# Patient Record
Sex: Female | Born: 1970 | Race: White | Hispanic: No | Marital: Married | Smoking: Never smoker
Health system: Southern US, Community
[De-identification: ages and names within clinical notes are randomized; demographics above are authoritative.]

## PROBLEM LIST (undated history)

## (undated) DIAGNOSIS — I8390 Asymptomatic varicose veins of unspecified lower extremity: Secondary | ICD-10-CM

## (undated) DIAGNOSIS — M79603 Pain in arm, unspecified: Secondary | ICD-10-CM

## (undated) DIAGNOSIS — D219 Benign neoplasm of connective and other soft tissue, unspecified: Secondary | ICD-10-CM

## (undated) HISTORY — DX: Pain in arm, unspecified: M79.603

## (undated) HISTORY — PX: NO PAST SURGERIES: SHX2092

## (undated) HISTORY — DX: Asymptomatic varicose veins of unspecified lower extremity: I83.90

## (undated) HISTORY — DX: Benign neoplasm of connective and other soft tissue, unspecified: D21.9

---

## 2005-06-27 ENCOUNTER — Other Ambulatory Visit: Admission: RE | Admit: 2005-06-27 | Discharge: 2005-06-27 | Payer: Self-pay | Admitting: Obstetrics and Gynecology

## 2005-07-07 ENCOUNTER — Other Ambulatory Visit: Admission: RE | Admit: 2005-07-07 | Discharge: 2005-07-07 | Payer: Self-pay | Admitting: Obstetrics and Gynecology

## 2007-04-21 ENCOUNTER — Emergency Department (HOSPITAL_COMMUNITY): Admission: EM | Admit: 2007-04-21 | Discharge: 2007-04-21 | Payer: Self-pay | Admitting: Emergency Medicine

## 2007-08-12 ENCOUNTER — Inpatient Hospital Stay (HOSPITAL_COMMUNITY): Admission: AD | Admit: 2007-08-12 | Discharge: 2007-08-15 | Payer: Self-pay | Admitting: Obstetrics and Gynecology

## 2008-08-03 ENCOUNTER — Ambulatory Visit (HOSPITAL_COMMUNITY): Admission: RE | Admit: 2008-08-03 | Discharge: 2008-08-03 | Payer: Self-pay | Admitting: Obstetrics and Gynecology

## 2008-09-11 ENCOUNTER — Inpatient Hospital Stay (HOSPITAL_COMMUNITY): Admission: AD | Admit: 2008-09-11 | Discharge: 2008-09-11 | Payer: Self-pay | Admitting: Obstetrics and Gynecology

## 2009-03-14 ENCOUNTER — Inpatient Hospital Stay (HOSPITAL_COMMUNITY): Admission: AD | Admit: 2009-03-14 | Discharge: 2009-03-16 | Payer: Self-pay | Admitting: Obstetrics and Gynecology

## 2009-09-28 ENCOUNTER — Emergency Department (HOSPITAL_COMMUNITY): Admission: EM | Admit: 2009-09-28 | Discharge: 2009-09-28 | Payer: Self-pay | Admitting: Emergency Medicine

## 2009-10-01 ENCOUNTER — Encounter: Admission: RE | Admit: 2009-10-01 | Discharge: 2009-10-01 | Payer: Self-pay | Admitting: Obstetrics and Gynecology

## 2010-03-21 ENCOUNTER — Emergency Department (HOSPITAL_BASED_OUTPATIENT_CLINIC_OR_DEPARTMENT_OTHER): Admission: EM | Admit: 2010-03-21 | Discharge: 2010-03-21 | Payer: Self-pay | Admitting: Emergency Medicine

## 2010-10-26 ENCOUNTER — Inpatient Hospital Stay (HOSPITAL_COMMUNITY): Payer: BC Managed Care – PPO

## 2010-10-26 ENCOUNTER — Inpatient Hospital Stay (HOSPITAL_COMMUNITY)
Admission: AD | Admit: 2010-10-26 | Discharge: 2010-10-28 | DRG: 886 | Disposition: A | Discharge status: Home / Self Care | Payer: BC Managed Care – PPO | Source: Ambulatory Visit | Attending: Obstetrics and Gynecology | Admitting: Obstetrics and Gynecology

## 2010-10-26 ENCOUNTER — Encounter (HOSPITAL_COMMUNITY): Payer: Self-pay | Admitting: Radiology

## 2010-10-26 DIAGNOSIS — O2390 Unspecified genitourinary tract infection in pregnancy, unspecified trimester: Secondary | ICD-10-CM

## 2010-10-26 DIAGNOSIS — R109 Unspecified abdominal pain: Secondary | ICD-10-CM

## 2010-10-26 DIAGNOSIS — N12 Tubulo-interstitial nephritis, not specified as acute or chronic: Secondary | ICD-10-CM

## 2010-10-26 LAB — URINALYSIS, ROUTINE W REFLEX MICROSCOPIC
Bilirubin Urine: NEGATIVE
Urine Glucose, Fasting: NEGATIVE mg/dL
Urobilinogen, UA: 0.2 mg/dL (ref 0.0–1.0)
pH: 6.5 (ref 5.0–8.0)

## 2010-10-26 LAB — COMPREHENSIVE METABOLIC PANEL
AST: 14 U/L (ref 0–37)
BUN: 8 mg/dL (ref 6–23)
CO2: 22 mEq/L (ref 19–32)
Calcium: 8.8 mg/dL (ref 8.4–10.5)
Creatinine, Ser: 0.45 mg/dL (ref 0.4–1.2)
GFR calc non Af Amer: >60 mL/min (ref >60)
Glucose, Bld: 80 mg/dL (ref 70–99)
Potassium: 4.2 mEq/L (ref 3.5–5.1)
Total Bilirubin: 0.7 mg/dL (ref 0.3–1.2)
Total Protein: 6.6 g/dL (ref 6.0–8.3)

## 2010-10-26 LAB — DIFFERENTIAL
Basophils Absolute: 0 10*3/uL (ref 0.0–0.1)
Basophils Relative: 0 % (ref 0–1)
Eosinophils Relative: 0 % (ref 0–5)
Lymphocytes Relative: 10 % — ABNORMAL LOW (ref 12–46)

## 2010-10-26 LAB — CBC
Hemoglobin: 11.9 g/dL — ABNORMAL LOW (ref 12.0–15.0)
MCV: 87.6 fL (ref 78.0–100.0)
Platelets: 194 10*3/uL (ref 150–400)
RBC: 3.88 MIL/uL (ref 3.87–5.11)
RDW: 13.6 % (ref 11.5–15.5)
WBC: 14.9 10*3/uL — ABNORMAL HIGH (ref 4.0–10.5)

## 2010-10-26 LAB — URINE MICROSCOPIC-ADD ON

## 2010-10-27 LAB — CBC
Hemoglobin: 10.4 g/dL — ABNORMAL LOW (ref 12.0–15.0)
MCHC: 34.3 g/dL (ref 30.0–36.0)
WBC: 9 10*3/uL (ref 4.0–10.5)

## 2010-10-28 LAB — URINE CULTURE

## 2010-11-14 NOTE — Discharge Summary (Signed)
  NAME:  Maria Leonard, BARRIE            ACCOUNT NO.:  [id number]  MEDICAL RECORD NO.:  [id number]           PATIENT TYPE:  I  LOCATION:  9157                          FACILITY:  [facility]  PHYSICIAN:  Carrington Clamp, M.D. DATE OF BIRTH:  1971-02-07  DATE OF ADMISSION:  10/26/2010 DATE OF DISCHARGE:  10/28/2010                              DISCHARGE SUMMARY   FINAL DIAGNOSES: 1. Intrauterine pregnancy at 29-1/7 weeks' gestation. 2. Flank pain. 3. Pyelonephritis.  COMPLICATIONS:  None.  This 40 year old G2, P1-0-0-1 presents at 29+ weeks' gestation with nausea and flank pain.  The patient was afebrile, but her urinalysis showed numerous white blood cells and red blood cells.  The patient did have CVA tenderness and had an elevated white count of 14.9.  She was admitted.  Fetal heart tones were good and the patient was having good fetal movement.  The patient was started on IV hydration, Pain medicin,e and gentamicin and clindamycin to treat pyelonephritis. The patient has a PENICILLIN allergy.  By hospital day #1, the patient's pain had resolved.  Her white blood cell count had decreased to 9.0 and she was feeling much better.  She was kept in-house until hospital day #3.  Complete pain resolved.  No nausea, vomiting, eating well and was ready for discharge.  She remained afebrile since admission.  Good fetal movement with no abdominal cramping or contraction.  She was sent home with oral erythromycin 333 to take one t.i.d. for a full 7 days.  The patient desires to continue straining her urine in case there was a possible kidney stone.  She was sent home with a strainer.  She has an appointment on Tuesday for recheck.  She is to keep this appointment, and instructions and precautions were reviewed with the patient.  LABS ON DISCHARGE:  The patient's he white blood cell count had decreased to 9.0 from 14.9.     Leilani Able, P.A.-C.   ______________________________ Carrington Clamp, M.D.    MB/MEDQ  D:  10/28/2010  T:  10/28/2010  Job:  [id number]  Electronically Signed by Leilani Able P.A.-C. on 10/28/2010 03:33:52 PM Electronically Signed by Carrington Clamp MD on 11/14/2010 08:05:39 AM

## 2010-12-18 LAB — CBC
HCT: 32.1 % — ABNORMAL LOW (ref 36.0–46.0)
HCT: 34.2 % — ABNORMAL LOW (ref 36.0–46.0)
Hemoglobin: 11.5 g/dL — ABNORMAL LOW (ref 12.0–15.0)
Hemoglobin: 12.2 g/dL (ref 12.0–15.0)
MCHC: 35.6 g/dL (ref 30.0–36.0)
Platelets: 125 10*3/uL — ABNORMAL LOW (ref 150–400)
RBC: 3.5 MIL/uL — ABNORMAL LOW (ref 3.87–5.11)
RDW: 13.4 % (ref 11.5–15.5)
WBC: 9.6 10*3/uL (ref 4.0–10.5)
WBC: 9.8 10*3/uL (ref 4.0–10.5)

## 2010-12-18 LAB — RPR: RPR Ser Ql: NONREACTIVE

## 2010-12-26 LAB — COMPREHENSIVE METABOLIC PANEL
Alkaline Phosphatase: 34 U/L — ABNORMAL LOW (ref 39–117)
BUN: 10 mg/dL (ref 6–23)
CO2: 20 mEq/L (ref 19–32)
Calcium: 8 mg/dL — ABNORMAL LOW (ref 8.4–10.5)
Creatinine, Ser: 0.44 mg/dL (ref 0.4–1.2)
Glucose, Bld: 119 mg/dL — ABNORMAL HIGH (ref 70–99)
Sodium: 135 mEq/L (ref 135–145)
Total Bilirubin: 1 mg/dL (ref 0.3–1.2)
Total Protein: 6.2 g/dL (ref 6.0–8.3)

## 2010-12-26 LAB — URINALYSIS, ROUTINE W REFLEX MICROSCOPIC
Leukocytes, UA: NEGATIVE
Protein, ur: NEGATIVE mg/dL
Urobilinogen, UA: 0.2 mg/dL (ref 0.0–1.0)

## 2010-12-26 LAB — CBC
Hemoglobin: 12.6 g/dL (ref 12.0–15.0)
MCHC: 35.3 g/dL (ref 30.0–36.0)
MCV: 86.4 fL (ref 78.0–100.0)
RBC: 4.14 MIL/uL (ref 3.87–5.11)
RDW: 12.8 % (ref 11.5–15.5)

## 2010-12-26 LAB — URINE MICROSCOPIC-ADD ON

## 2011-01-06 ENCOUNTER — Inpatient Hospital Stay (HOSPITAL_COMMUNITY)
Admission: AD | Admit: 2011-01-06 | Discharge: 2011-01-06 | Disposition: A | Discharge status: Home / Self Care | Payer: BC Managed Care – PPO | Source: Ambulatory Visit | Attending: Obstetrics and Gynecology | Admitting: Obstetrics and Gynecology

## 2011-01-06 DIAGNOSIS — O479 False labor, unspecified: Secondary | ICD-10-CM | POA: Insufficient documentation

## 2011-01-07 ENCOUNTER — Inpatient Hospital Stay (HOSPITAL_COMMUNITY): Payer: BC Managed Care – PPO

## 2011-01-09 ENCOUNTER — Inpatient Hospital Stay (HOSPITAL_COMMUNITY)
Admission: AD | Admit: 2011-01-09 | Discharge: 2011-01-11 | DRG: 373 | Disposition: A | Discharge status: Home / Self Care | Payer: BC Managed Care – PPO | Source: Ambulatory Visit | Attending: Obstetrics & Gynecology | Admitting: Obstetrics & Gynecology

## 2011-01-09 DIAGNOSIS — O09529 Supervision of elderly multigravida, unspecified trimester: Secondary | ICD-10-CM | POA: Diagnosis present

## 2011-01-09 LAB — CBC
MCV: 88.4 fL (ref 78.0–100.0)
WBC: 11.6 10*3/uL — ABNORMAL HIGH (ref 4.0–10.5)

## 2011-01-10 LAB — CBC
HCT: 30.4 % — ABNORMAL LOW (ref 36.0–46.0)
MCH: 30.3 pg (ref 26.0–34.0)
MCHC: 34.2 g/dL (ref 30.0–36.0)
MCV: 88.6 fL (ref 78.0–100.0)
Platelets: 150 10*3/uL (ref 150–400)
RDW: 13.5 % (ref 11.5–15.5)

## 2011-01-24 NOTE — Discharge Summary (Signed)
NAME:  Maria Leonard, Maria Leonard            ACCOUNT NO.:  [id number]   MEDICAL RECORD NO.:  [id number]          PATIENT TYPE:  INP   LOCATION:  9130                          FACILITY:  [facility]   PHYSICIAN:  Gerrit Friends. Aldona Bar, M.D.   DATE OF BIRTH:  [birth year]   DATE OF ADMISSION:  08/12/2007  DATE OF DISCHARGE:  08/15/2007                               DISCHARGE SUMMARY   DISCHARGE DIAGNOSES:  1. Term pregnancy, delivered 8 pounds 5 ounces female infant, Apgars 8      and 9.  2. Blood type AB positive.   PROCEDURES:  1. Low forceps delivery.  2. Vaginal tear and right mediolateral tear and repair.   SUMMARY:  This 40 year old primigravida was admitted at term with  ruptured membranes and some contractions.  She completed the first stage  without difficulty.  A second stage of her labor was complicated by  variable decelerations and tachycardia.  After an hour and half of  pushing Dr. Dareen Piano delivered the patient with Simpson forceps placed  at +2 station/ROA.  She was delivered of an 8 pound 5 ounce female infant.  A very short cord (15 cm) was noted.  A right vaginal wall tear and a  right mediolateral tear were repaired without difficulty.  The patient's  postpartum course subsequently was relatively uncomplicated except with  the problem that she was having with her chronic hemorrhoids.  Discharge  hemoglobin on the morning of December 3 was 8.8 with a white count  12,300 and platelet count 132,000.  On the morning of December 4 she was  stable.  Her breast-feeding was going well.  She was normotensive.  Her  bottom was more comfortable.  She was having normal bowel and bladder  function, and after discussion with the patient she was deemed ready for  discharge and accordingly was given all appropriate instructions and  understood all instructions well.   Discharge brochure was given to the patient at time of discharge.  Medications will include vitamins one a day as long she is breast-  feeding and Feosol capsules - one daily.  She was also given a  prescription for Tylox to use 1-2 every 4-6 hours as needed for severe  pain and Motrin 600 mg every six hours as needed for lesser pain and  cramping.  She was also instructed on the use of over-the-counter  medications for her hemorrhoids.   She will return to the office follow-up in approximately four weeks'  time or as needed.   CONDITION ON DISCHARGE:  Improved.      Gerrit Friends. Aldona Bar, M.D.  Electronically Signed     RMW/MEDQ  D:  08/15/2007  T:  08/15/2007  Job:  [id number]

## 2011-06-19 LAB — CBC
MCHC: 36
MCV: 89.6
Platelets: 132 — ABNORMAL LOW
Platelets: 206
RDW: 13.2
WBC: 12.3 — ABNORMAL HIGH

## 2011-06-19 LAB — RPR: RPR Ser Ql: NONREACTIVE

## 2011-06-26 LAB — CBC
HCT: 32.2 — ABNORMAL LOW
MCV: 88.1
Platelets: 204
RDW: 13.5
WBC: 11.8 — ABNORMAL HIGH

## 2011-06-26 LAB — POCT I-STAT CREATININE: Operator id: 192351

## 2011-06-26 LAB — I-STAT 8, (EC8 V) (CONVERTED LAB)
Acid-base deficit: 3 — ABNORMAL HIGH
Bicarbonate: 21.8
Chloride: 107
pCO2, Ven: 37.7 — ABNORMAL LOW
pH, Ven: 7.37 — ABNORMAL HIGH

## 2011-06-26 LAB — DIFFERENTIAL
Basophils Absolute: 0
Basophils Relative: 0
Eosinophils Absolute: 0.1
Eosinophils Relative: 1
Lymphs Abs: 1.8
Neutrophils Relative %: 74

## 2011-06-26 LAB — POCT CARDIAC MARKERS
CKMB, poc: <1 — ABNORMAL LOW
Myoglobin, poc: 22.9
Operator id: 192351
Troponin i, poc: <0.05

## 2011-06-27 ENCOUNTER — Encounter (HOSPITAL_COMMUNITY): Payer: Self-pay | Admitting: *Deleted

## 2012-04-10 ENCOUNTER — Other Ambulatory Visit: Payer: Self-pay | Admitting: Obstetrics and Gynecology

## 2012-04-10 DIAGNOSIS — N63 Unspecified lump in unspecified breast: Secondary | ICD-10-CM

## 2012-04-15 ENCOUNTER — Other Ambulatory Visit: Payer: BC Managed Care – PPO

## 2012-04-17 ENCOUNTER — Other Ambulatory Visit: Payer: Self-pay | Admitting: Obstetrics and Gynecology

## 2012-04-17 ENCOUNTER — Ambulatory Visit
Admission: RE | Admit: 2012-04-17 | Discharge: 2012-04-17 | Disposition: A | Discharge status: Home / Self Care | Payer: BC Managed Care – PPO | Source: Ambulatory Visit | Attending: Obstetrics and Gynecology | Admitting: Obstetrics and Gynecology

## 2012-04-17 DIAGNOSIS — N63 Unspecified lump in unspecified breast: Secondary | ICD-10-CM

## 2013-03-11 DEATH — deceased

## 2013-04-10 ENCOUNTER — Other Ambulatory Visit: Payer: Self-pay

## 2013-04-10 ENCOUNTER — Other Ambulatory Visit: Payer: Self-pay | Admitting: Obstetrics and Gynecology

## 2013-04-10 DIAGNOSIS — Z1231 Encounter for screening mammogram for malignant neoplasm of breast: Secondary | ICD-10-CM

## 2013-04-29 ENCOUNTER — Ambulatory Visit
Admission: RE | Admit: 2013-04-29 | Discharge: 2013-04-29 | Disposition: A | Discharge status: Home / Self Care | Payer: BC Managed Care – PPO | Source: Ambulatory Visit

## 2013-04-29 ENCOUNTER — Other Ambulatory Visit: Payer: Self-pay | Admitting: Family Medicine

## 2013-04-29 DIAGNOSIS — M546 Pain in thoracic spine: Secondary | ICD-10-CM

## 2013-04-29 DIAGNOSIS — Z1231 Encounter for screening mammogram for malignant neoplasm of breast: Secondary | ICD-10-CM

## 2013-05-02 ENCOUNTER — Ambulatory Visit
Admission: RE | Admit: 2013-05-02 | Discharge: 2013-05-02 | Disposition: A | Discharge status: Home / Self Care | Payer: BC Managed Care – PPO | Source: Ambulatory Visit | Attending: Family Medicine | Admitting: Family Medicine

## 2013-05-02 DIAGNOSIS — M546 Pain in thoracic spine: Secondary | ICD-10-CM

## 2014-03-25 ENCOUNTER — Other Ambulatory Visit: Payer: Self-pay

## 2014-03-25 DIAGNOSIS — Z1231 Encounter for screening mammogram for malignant neoplasm of breast: Secondary | ICD-10-CM

## 2014-05-04 ENCOUNTER — Ambulatory Visit
Admission: RE | Admit: 2014-05-04 | Discharge: 2014-05-04 | Disposition: A | Discharge status: Home / Self Care | Payer: BC Managed Care – PPO | Source: Ambulatory Visit

## 2014-05-04 DIAGNOSIS — Z1231 Encounter for screening mammogram for malignant neoplasm of breast: Secondary | ICD-10-CM

## 2014-07-13 ENCOUNTER — Encounter (HOSPITAL_COMMUNITY): Payer: Self-pay | Admitting: *Deleted

## 2015-03-09 ENCOUNTER — Other Ambulatory Visit: Payer: Self-pay

## 2015-03-09 DIAGNOSIS — Z1231 Encounter for screening mammogram for malignant neoplasm of breast: Secondary | ICD-10-CM

## 2015-05-11 ENCOUNTER — Ambulatory Visit
Admission: RE | Admit: 2015-05-11 | Discharge: 2015-05-11 | Disposition: A | Discharge status: Home / Self Care | Payer: BLUE CROSS/BLUE SHIELD | Source: Ambulatory Visit

## 2015-05-11 DIAGNOSIS — Z1231 Encounter for screening mammogram for malignant neoplasm of breast: Secondary | ICD-10-CM

## 2015-07-14 ENCOUNTER — Encounter: Payer: Self-pay | Admitting: Neurology

## 2015-07-14 ENCOUNTER — Ambulatory Visit (INDEPENDENT_AMBULATORY_CARE_PROVIDER_SITE_OTHER): Payer: BLUE CROSS/BLUE SHIELD | Admitting: Neurology

## 2015-07-14 VITALS — BP 102/67 | HR 80 | Ht 63.0 in | Wt 152.0 lb

## 2015-07-14 DIAGNOSIS — M79601 Pain in right arm: Secondary | ICD-10-CM | POA: Insufficient documentation

## 2015-07-14 NOTE — Progress Notes (Signed)
PATIENT: Maria Leonard DOB: May 24, 1971  Chief Complaint  Patient presents with  . Arm Pain    She has been having pain in her right arm and swelling her right hand.  Symptoms are intermittent and started several years ago.     HISTORICAL  Maria SmithIs a 44 years old right-handed female, seen in refer by her primary care physician Dr. Betty G Martinique in November third 2016 for evaluation of right arm deep achy pain,  She reported since 2012, without clear triggers, she noticed deep achy pain starting from right lateral shoulder, radiating to right arm, symptoms has been persistent since onset, no significant worsening, she also noticed right hand swelling in the morning, she denies neck pain, no weakness, no sensory loss.  She denied left side involvement, no gait difficulty.  I have personally reviewed MRI of the thoracic spine August 2014 for evaluation of upper back pain, no significant abnormality, mild T7-8 degenerative disc disease, no canal stenosis.  X-ray of right humerus showed sclerotic focus within the right humeral neck region which could represent a bone infarct old or enchondroma  REVIEW OF SYSTEMS: Full 14 system review of systems performed and notable only for memory loss, insomnia, not enough sleep  ALLERGIES: Allergies  Allergen Reactions  . Penicillins Hives    HOME MEDICATIONS: Current Outpatient Prescriptions  Medication Sig Dispense Refill  . diphenhydramine-acetaminophen (TYLENOL PM) 25-500 MG TABS tablet Take 1 tablet by mouth at bedtime as needed.    . Famotidine (PEPCID PO) Take by mouth as needed.     No current facility-administered medications for this visit.    PAST MEDICAL HISTORY: Past Medical History  Diagnosis Date  . Fibroids   . Varicose veins   . Upper extremity pain     PAST SURGICAL HISTORY: Past Surgical History  Procedure Laterality Date  . No past surgeries      FAMILY HISTORY: Family History  Problem  Relation Age of Onset  . Lung cancer Father   . Pancreatic cancer Paternal Grandfather   . Liver cancer Paternal Grandfather   . Heart attack Paternal Grandmother     SOCIAL HISTORY:  Social History   Social History  . Marital Status: Married    Spouse Name: N/A  . Number of Children: 3  . Years of Education: Masters   Occupational History  . Business Controller    Social History Main Topics  . Smoking status: Never Smoker   . Smokeless tobacco: Not on file  . Alcohol Use: 0.0 oz/week    0 Standard drinks or equivalent per week     Comment: Social  . Drug Use: No  . Sexual Activity: Not on file   Other Topics Concern  . Not on file   Social History Narrative   Lives at home with husband, three children and her mother.   Right-handed.   Occasional caffeine use.     PHYSICAL EXAM   Filed Vitals:   07/14/15 1554  BP: 102/67  Pulse: 80  Height: 5\' 3"  (1.6 m)  Weight: 152 lb (68.947 kg)    Not recorded      Body mass index is 26.93 kg/(m^2).  PHYSICAL EXAMNIATION:  Gen: NAD, conversant, well nourised, obese, well groomed                     Cardiovascular: Regular rate rhythm, no peripheral edema, warm, nontender. Eyes: Conjunctivae clear without exudates or hemorrhage Neck: Supple, no carotid bruise. Pulmonary:  Clear to auscultation bilaterally   NEUROLOGICAL EXAM:  MENTAL STATUS: Speech:    Speech is normal; fluent and spontaneous with normal comprehension.  Cognition:     Orientation to time, place and person     Normal recent and remote memory     Normal Attention span and concentration     Normal Language, naming, repeating,spontaneous speech     Fund of knowledge   CRANIAL NERVES: CN II: Visual fields are full to confrontation. Fundoscopic exam is normal with sharp discs and no vascular changes. Pupils are round equal and briskly reactive to light. CN III, IV, VI: extraocular movement are normal. No ptosis. CN V: Facial sensation is intact  to pinprick in all 3 divisions bilaterally. Corneal responses are intact.  CN VII: Face is symmetric with normal eye closure and smile. CN VIII: Hearing is normal to rubbing fingers CN IX, X: Palate elevates symmetrically. Phonation is normal. CN XI: Head turning and shoulder shrug are intact CN XII: Tongue is midline with normal movements and no atrophy.  MOTOR: There is no pronator drift of out-stretched arms. Muscle bulk and tone are normal. Muscle strength is normal.  REFLEXES: Reflexes are 2+ and symmetric at the biceps, triceps, knees, and ankles. Plantar responses are flexor.  SENSORY: Intact to light touch, pinprick, position sense, and vibration sense are intact in fingers and toes.  COORDINATION: Rapid alternating movements and fine finger movements are intact. There is no dysmetria on finger-to-nose and heel-knee-shin.    GAIT/STANCE: Posture is normal. Gait is steady with normal steps, base, arm swing, and turning. Heel and toe walking are normal. Tandem gait is normal.  Romberg is absent.   DIAGNOSTIC DATA (LABS, IMAGING, TESTING) - I reviewed patient records, labs, notes, testing and imaging myself where available.   ASSESSMENT AND PLAN  Eliabeth Leonard is a 44 y.o. female   Chronic right arm deep achy pain,  Normal neurological examinations, no evidence of right upper extremity neuropathy or right cervical radiculopathy  X-ray of right humerus in October 2016 showed sclerotic focus within the right humeral neck which could represent bone infarct or enchondroma.  Have encouraged her neck shoulder stretching exercise, when necessary NSAIDs, hot compression   If she continue complaining worsening symptoms, may consider MRI of right humeral region with described mild abnormality.  Marcial Pacas, M.D. Ph.D.  [facility] [address], [place], [place] [address] Ph: [phone] Fax: [phone]  CC: Betty G Martinique, MD

## 2015-07-15 NOTE — Addendum Note (Signed)
Addended by: Marcial Pacas on: 07/15/2015 10:36 AM   Modules accepted: Level of Service

## 2016-03-30 ENCOUNTER — Other Ambulatory Visit: Payer: Self-pay | Admitting: Obstetrics and Gynecology

## 2016-03-30 DIAGNOSIS — Z1231 Encounter for screening mammogram for malignant neoplasm of breast: Secondary | ICD-10-CM

## 2016-04-04 DIAGNOSIS — R7611 Nonspecific reaction to tuberculin skin test without active tuberculosis: Secondary | ICD-10-CM | POA: Diagnosis not present

## 2016-04-04 DIAGNOSIS — Z0000 Encounter for general adult medical examination without abnormal findings: Secondary | ICD-10-CM | POA: Diagnosis not present

## 2016-04-04 DIAGNOSIS — Z113 Encounter for screening for infections with a predominantly sexual mode of transmission: Secondary | ICD-10-CM | POA: Diagnosis not present

## 2016-05-16 ENCOUNTER — Ambulatory Visit
Admission: RE | Admit: 2016-05-16 | Discharge: 2016-05-16 | Disposition: A | Discharge status: Home / Self Care | Payer: BLUE CROSS/BLUE SHIELD | Source: Ambulatory Visit | Attending: Obstetrics and Gynecology | Admitting: Obstetrics and Gynecology

## 2016-05-16 DIAGNOSIS — Z1231 Encounter for screening mammogram for malignant neoplasm of breast: Secondary | ICD-10-CM

## 2016-07-13 DIAGNOSIS — Z01419 Encounter for gynecological examination (general) (routine) without abnormal findings: Secondary | ICD-10-CM | POA: Diagnosis not present

## 2016-07-13 DIAGNOSIS — Z6827 Body mass index (BMI) 27.0-27.9, adult: Secondary | ICD-10-CM | POA: Diagnosis not present

## 2016-07-13 DIAGNOSIS — Z124 Encounter for screening for malignant neoplasm of cervix: Secondary | ICD-10-CM | POA: Diagnosis not present

## 2016-12-14 DIAGNOSIS — M25511 Pain in right shoulder: Secondary | ICD-10-CM | POA: Diagnosis not present

## 2016-12-14 DIAGNOSIS — M67431 Ganglion, right wrist: Secondary | ICD-10-CM | POA: Diagnosis not present

## 2017-01-02 DIAGNOSIS — L609 Nail disorder, unspecified: Secondary | ICD-10-CM | POA: Diagnosis not present

## 2017-01-02 DIAGNOSIS — Z1159 Encounter for screening for other viral diseases: Secondary | ICD-10-CM | POA: Diagnosis not present

## 2017-01-02 DIAGNOSIS — Z0282 Encounter for adoption services: Secondary | ICD-10-CM | POA: Diagnosis not present

## 2017-01-15 DIAGNOSIS — M940 Chondrocostal junction syndrome [Tietze]: Secondary | ICD-10-CM | POA: Diagnosis not present

## 2017-01-15 DIAGNOSIS — R079 Chest pain, unspecified: Secondary | ICD-10-CM | POA: Diagnosis not present

## 2017-01-15 DIAGNOSIS — R1310 Dysphagia, unspecified: Secondary | ICD-10-CM | POA: Diagnosis not present

## 2017-01-17 DIAGNOSIS — N390 Urinary tract infection, site not specified: Secondary | ICD-10-CM | POA: Diagnosis not present

## 2017-01-23 ENCOUNTER — Other Ambulatory Visit: Payer: Self-pay | Admitting: Physician Assistant

## 2017-01-23 DIAGNOSIS — R1011 Right upper quadrant pain: Secondary | ICD-10-CM

## 2017-01-25 DIAGNOSIS — N390 Urinary tract infection, site not specified: Secondary | ICD-10-CM | POA: Diagnosis not present

## 2017-01-25 DIAGNOSIS — N760 Acute vaginitis: Secondary | ICD-10-CM | POA: Diagnosis not present

## 2017-01-30 DIAGNOSIS — F4322 Adjustment disorder with anxiety: Secondary | ICD-10-CM | POA: Diagnosis not present

## 2017-02-02 ENCOUNTER — Ambulatory Visit
Admission: RE | Admit: 2017-02-02 | Discharge: 2017-02-02 | Disposition: A | Discharge status: Home / Self Care | Payer: BLUE CROSS/BLUE SHIELD | Source: Ambulatory Visit | Attending: Physician Assistant | Admitting: Physician Assistant

## 2017-02-02 DIAGNOSIS — R1011 Right upper quadrant pain: Secondary | ICD-10-CM

## 2017-02-14 DIAGNOSIS — R319 Hematuria, unspecified: Secondary | ICD-10-CM | POA: Diagnosis not present

## 2017-02-14 DIAGNOSIS — R1011 Right upper quadrant pain: Secondary | ICD-10-CM | POA: Diagnosis not present

## 2017-02-14 DIAGNOSIS — N390 Urinary tract infection, site not specified: Secondary | ICD-10-CM | POA: Diagnosis not present

## 2017-02-14 DIAGNOSIS — R8290 Unspecified abnormal findings in urine: Secondary | ICD-10-CM | POA: Diagnosis not present

## 2017-02-26 DIAGNOSIS — R109 Unspecified abdominal pain: Secondary | ICD-10-CM | POA: Diagnosis not present

## 2017-03-01 DIAGNOSIS — R3121 Asymptomatic microscopic hematuria: Secondary | ICD-10-CM | POA: Diagnosis not present

## 2017-03-01 DIAGNOSIS — R109 Unspecified abdominal pain: Secondary | ICD-10-CM | POA: Diagnosis not present

## 2017-03-06 DIAGNOSIS — R109 Unspecified abdominal pain: Secondary | ICD-10-CM | POA: Diagnosis not present

## 2017-03-06 DIAGNOSIS — R3121 Asymptomatic microscopic hematuria: Secondary | ICD-10-CM | POA: Diagnosis not present

## 2017-05-10 DIAGNOSIS — L821 Other seborrheic keratosis: Secondary | ICD-10-CM | POA: Diagnosis not present

## 2017-05-10 DIAGNOSIS — D2262 Melanocytic nevi of left upper limb, including shoulder: Secondary | ICD-10-CM | POA: Diagnosis not present

## 2017-05-10 DIAGNOSIS — D225 Melanocytic nevi of trunk: Secondary | ICD-10-CM | POA: Diagnosis not present

## 2017-05-10 DIAGNOSIS — D1801 Hemangioma of skin and subcutaneous tissue: Secondary | ICD-10-CM | POA: Diagnosis not present

## 2017-07-09 ENCOUNTER — Other Ambulatory Visit: Payer: Self-pay | Admitting: Obstetrics and Gynecology

## 2017-07-09 DIAGNOSIS — Z1231 Encounter for screening mammogram for malignant neoplasm of breast: Secondary | ICD-10-CM

## 2017-07-20 ENCOUNTER — Ambulatory Visit
Admission: RE | Admit: 2017-07-20 | Discharge: 2017-07-20 | Disposition: A | Discharge status: Home / Self Care | Payer: BLUE CROSS/BLUE SHIELD | Source: Ambulatory Visit | Attending: Obstetrics and Gynecology | Admitting: Obstetrics and Gynecology

## 2017-07-20 DIAGNOSIS — Z1231 Encounter for screening mammogram for malignant neoplasm of breast: Secondary | ICD-10-CM | POA: Diagnosis not present

## 2017-09-19 DIAGNOSIS — Z801 Family history of malignant neoplasm of trachea, bronchus and lung: Secondary | ICD-10-CM | POA: Diagnosis not present

## 2017-09-19 DIAGNOSIS — Z124 Encounter for screening for malignant neoplasm of cervix: Secondary | ICD-10-CM | POA: Diagnosis not present

## 2017-09-19 DIAGNOSIS — Z800 Family history of malignant neoplasm of digestive organs: Secondary | ICD-10-CM | POA: Diagnosis not present

## 2017-09-19 DIAGNOSIS — Z01419 Encounter for gynecological examination (general) (routine) without abnormal findings: Secondary | ICD-10-CM | POA: Diagnosis not present

## 2017-09-19 DIAGNOSIS — Z803 Family history of malignant neoplasm of breast: Secondary | ICD-10-CM | POA: Diagnosis not present

## 2017-09-19 DIAGNOSIS — R8271 Bacteriuria: Secondary | ICD-10-CM | POA: Diagnosis not present

## 2017-09-19 DIAGNOSIS — Z808 Family history of malignant neoplasm of other organs or systems: Secondary | ICD-10-CM | POA: Diagnosis not present

## 2017-09-19 DIAGNOSIS — Z6828 Body mass index (BMI) 28.0-28.9, adult: Secondary | ICD-10-CM | POA: Diagnosis not present

## 2018-04-12 ENCOUNTER — Other Ambulatory Visit: Payer: Self-pay | Admitting: Obstetrics and Gynecology

## 2018-04-12 DIAGNOSIS — Z1231 Encounter for screening mammogram for malignant neoplasm of breast: Secondary | ICD-10-CM

## 2018-07-16 ENCOUNTER — Other Ambulatory Visit: Payer: Self-pay | Admitting: Gastroenterology

## 2018-07-16 DIAGNOSIS — K219 Gastro-esophageal reflux disease without esophagitis: Secondary | ICD-10-CM | POA: Diagnosis not present

## 2018-07-16 DIAGNOSIS — R1310 Dysphagia, unspecified: Secondary | ICD-10-CM

## 2018-07-16 DIAGNOSIS — R1319 Other dysphagia: Secondary | ICD-10-CM

## 2018-07-22 ENCOUNTER — Ambulatory Visit
Admission: RE | Admit: 2018-07-22 | Discharge: 2018-07-22 | Disposition: A | Discharge status: Home / Self Care | Payer: BLUE CROSS/BLUE SHIELD | Source: Ambulatory Visit | Attending: Obstetrics and Gynecology | Admitting: Obstetrics and Gynecology

## 2018-07-22 DIAGNOSIS — Z1231 Encounter for screening mammogram for malignant neoplasm of breast: Secondary | ICD-10-CM | POA: Diagnosis not present

## 2018-07-24 ENCOUNTER — Other Ambulatory Visit: Payer: Self-pay | Admitting: Obstetrics and Gynecology

## 2018-07-24 DIAGNOSIS — R928 Other abnormal and inconclusive findings on diagnostic imaging of breast: Secondary | ICD-10-CM

## 2018-07-29 ENCOUNTER — Ambulatory Visit
Admission: RE | Admit: 2018-07-29 | Discharge: 2018-07-29 | Disposition: A | Discharge status: Home / Self Care | Payer: BLUE CROSS/BLUE SHIELD | Source: Ambulatory Visit | Attending: Obstetrics and Gynecology | Admitting: Obstetrics and Gynecology

## 2018-07-29 ENCOUNTER — Ambulatory Visit: Payer: BLUE CROSS/BLUE SHIELD

## 2018-07-29 DIAGNOSIS — R922 Inconclusive mammogram: Secondary | ICD-10-CM | POA: Diagnosis not present

## 2018-07-29 DIAGNOSIS — R928 Other abnormal and inconclusive findings on diagnostic imaging of breast: Secondary | ICD-10-CM

## 2018-09-12 ENCOUNTER — Other Ambulatory Visit: Payer: BLUE CROSS/BLUE SHIELD

## 2018-09-24 ENCOUNTER — Ambulatory Visit
Admission: RE | Admit: 2018-09-24 | Discharge: 2018-09-24 | Disposition: A | Discharge status: Home / Self Care | Payer: BLUE CROSS/BLUE SHIELD | Source: Ambulatory Visit | Attending: Gastroenterology | Admitting: Gastroenterology

## 2018-09-24 DIAGNOSIS — R1310 Dysphagia, unspecified: Secondary | ICD-10-CM

## 2018-09-24 DIAGNOSIS — R1319 Other dysphagia: Secondary | ICD-10-CM

## 2018-09-24 DIAGNOSIS — K219 Gastro-esophageal reflux disease without esophagitis: Secondary | ICD-10-CM | POA: Diagnosis not present

## 2018-10-31 DIAGNOSIS — Z713 Dietary counseling and surveillance: Secondary | ICD-10-CM | POA: Diagnosis not present

## 2018-11-12 DIAGNOSIS — Z01419 Encounter for gynecological examination (general) (routine) without abnormal findings: Secondary | ICD-10-CM | POA: Diagnosis not present

## 2018-11-12 DIAGNOSIS — Z6828 Body mass index (BMI) 28.0-28.9, adult: Secondary | ICD-10-CM | POA: Diagnosis not present

## 2019-03-28 DIAGNOSIS — S0502XA Injury of conjunctiva and corneal abrasion without foreign body, left eye, initial encounter: Secondary | ICD-10-CM | POA: Diagnosis not present

## 2019-06-10 DIAGNOSIS — Z23 Encounter for immunization: Secondary | ICD-10-CM | POA: Diagnosis not present

## 2019-06-20 DIAGNOSIS — L821 Other seborrheic keratosis: Secondary | ICD-10-CM | POA: Diagnosis not present

## 2019-07-11 ENCOUNTER — Other Ambulatory Visit: Payer: Self-pay | Admitting: Obstetrics and Gynecology

## 2019-07-11 DIAGNOSIS — Z1231 Encounter for screening mammogram for malignant neoplasm of breast: Secondary | ICD-10-CM

## 2019-09-23 ENCOUNTER — Other Ambulatory Visit: Payer: Self-pay

## 2019-09-23 ENCOUNTER — Ambulatory Visit
Admission: RE | Admit: 2019-09-23 | Discharge: 2019-09-23 | Disposition: A | Discharge status: Home / Self Care | Payer: BC Managed Care – PPO | Source: Ambulatory Visit | Attending: Obstetrics and Gynecology | Admitting: Obstetrics and Gynecology

## 2019-09-23 DIAGNOSIS — Z1231 Encounter for screening mammogram for malignant neoplasm of breast: Secondary | ICD-10-CM

## 2019-11-21 ENCOUNTER — Ambulatory Visit: Payer: BC Managed Care – PPO | Attending: Internal Medicine

## 2019-11-21 DIAGNOSIS — Z23 Encounter for immunization: Secondary | ICD-10-CM

## 2019-11-21 NOTE — Progress Notes (Signed)
   Covid-19 Vaccination Clinic  Name:  Maria Leonard    MRN: [id number] DOB: 1971/09/09  11/21/2019  [name] was observed post Covid-19 immunization for 15 minutes without incident. She was provided with Vaccine Information Sheet and instruction to access the V-Safe system.   [name] was instructed to call 911 with any severe reactions post vaccine: Marland Kitchen Difficulty breathing  . Swelling of face and throat  . A fast heartbeat  . A bad rash all over body  . Dizziness and weakness   Immunizations Administered    Name Date Dose VIS Date Route   Pfizer COVID-19 Vaccine 11/21/2019  2:46 PM 0.3 mL 08/22/2019 Intramuscular   Manufacturer: [place]   Lot: [id number]   [place]: [id number]

## 2019-11-27 DIAGNOSIS — Z6828 Body mass index (BMI) 28.0-28.9, adult: Secondary | ICD-10-CM | POA: Diagnosis not present

## 2019-11-27 DIAGNOSIS — Z01419 Encounter for gynecological examination (general) (routine) without abnormal findings: Secondary | ICD-10-CM | POA: Diagnosis not present

## 2019-12-15 ENCOUNTER — Ambulatory Visit: Payer: BC Managed Care – PPO | Attending: Internal Medicine

## 2019-12-15 DIAGNOSIS — Z23 Encounter for immunization: Secondary | ICD-10-CM

## 2019-12-15 NOTE — Progress Notes (Signed)
   Covid-19 Vaccination Clinic  Name:  Charmine Kimberlin    MRN: [id number] DOB: [birth year]  12/15/2019  Ms. Siebold was observed post Covid-19 immunization for 15 minutes without incident. She was provided with Vaccine Information Sheet and instruction to access the V-Safe system.   Ms. Cranor was instructed to call 911 with any severe reactions post vaccine: Marland Kitchen Difficulty breathing  . Swelling of face and throat  . A fast heartbeat  . A bad rash all over body  . Dizziness and weakness   Immunizations Administered    Name Date Dose VIS Date Route   Pfizer COVID-19 Vaccine 12/15/2019  4:43 PM 0.3 mL 08/22/2019 Intramuscular   Manufacturer: [place]   Lot: [id number]   [place]: [id number]

## 2020-02-19 DIAGNOSIS — Z13220 Encounter for screening for lipoid disorders: Secondary | ICD-10-CM | POA: Diagnosis not present

## 2020-02-19 DIAGNOSIS — Z0000 Encounter for general adult medical examination without abnormal findings: Secondary | ICD-10-CM | POA: Diagnosis not present

## 2020-02-19 DIAGNOSIS — Z23 Encounter for immunization: Secondary | ICD-10-CM | POA: Diagnosis not present

## 2020-02-19 DIAGNOSIS — Z131 Encounter for screening for diabetes mellitus: Secondary | ICD-10-CM | POA: Diagnosis not present

## 2020-03-05 DIAGNOSIS — B070 Plantar wart: Secondary | ICD-10-CM | POA: Diagnosis not present

## 2020-06-29 DIAGNOSIS — Z23 Encounter for immunization: Secondary | ICD-10-CM | POA: Diagnosis not present

## 2020-10-04 ENCOUNTER — Other Ambulatory Visit: Payer: Self-pay | Admitting: Obstetrics and Gynecology

## 2020-10-04 DIAGNOSIS — Z1231 Encounter for screening mammogram for malignant neoplasm of breast: Secondary | ICD-10-CM

## 2020-10-05 ENCOUNTER — Other Ambulatory Visit: Payer: Self-pay

## 2020-10-05 ENCOUNTER — Ambulatory Visit
Admission: RE | Admit: 2020-10-05 | Discharge: 2020-10-05 | Disposition: A | Discharge status: Home / Self Care | Payer: BC Managed Care – PPO | Source: Ambulatory Visit | Attending: Obstetrics and Gynecology | Admitting: Obstetrics and Gynecology

## 2020-10-05 DIAGNOSIS — Z1231 Encounter for screening mammogram for malignant neoplasm of breast: Secondary | ICD-10-CM

## 2020-12-21 DIAGNOSIS — Z6828 Body mass index (BMI) 28.0-28.9, adult: Secondary | ICD-10-CM | POA: Diagnosis not present

## 2020-12-21 DIAGNOSIS — Z01419 Encounter for gynecological examination (general) (routine) without abnormal findings: Secondary | ICD-10-CM | POA: Diagnosis not present

## 2021-05-09 DIAGNOSIS — M5441 Lumbago with sciatica, right side: Secondary | ICD-10-CM | POA: Diagnosis not present

## 2021-06-23 DIAGNOSIS — M545 Low back pain, unspecified: Secondary | ICD-10-CM | POA: Diagnosis not present

## 2021-07-15 DIAGNOSIS — Z131 Encounter for screening for diabetes mellitus: Secondary | ICD-10-CM | POA: Diagnosis not present

## 2021-07-15 DIAGNOSIS — Z23 Encounter for immunization: Secondary | ICD-10-CM | POA: Diagnosis not present

## 2021-07-15 DIAGNOSIS — Z13220 Encounter for screening for lipoid disorders: Secondary | ICD-10-CM | POA: Diagnosis not present

## 2021-07-15 DIAGNOSIS — L609 Nail disorder, unspecified: Secondary | ICD-10-CM | POA: Diagnosis not present

## 2021-07-15 DIAGNOSIS — Z0000 Encounter for general adult medical examination without abnormal findings: Secondary | ICD-10-CM | POA: Diagnosis not present

## 2021-09-28 ENCOUNTER — Other Ambulatory Visit: Payer: Self-pay | Admitting: Obstetrics and Gynecology

## 2021-09-28 DIAGNOSIS — Z1231 Encounter for screening mammogram for malignant neoplasm of breast: Secondary | ICD-10-CM

## 2021-10-10 ENCOUNTER — Ambulatory Visit
Admission: RE | Admit: 2021-10-10 | Discharge: 2021-10-10 | Disposition: A | Discharge status: Home / Self Care | Payer: BC Managed Care – PPO | Source: Ambulatory Visit

## 2021-10-10 ENCOUNTER — Other Ambulatory Visit: Payer: Self-pay

## 2021-10-10 DIAGNOSIS — Z1231 Encounter for screening mammogram for malignant neoplasm of breast: Secondary | ICD-10-CM

## 2022-01-19 DIAGNOSIS — Z01419 Encounter for gynecological examination (general) (routine) without abnormal findings: Secondary | ICD-10-CM | POA: Diagnosis not present

## 2022-07-19 DIAGNOSIS — Z23 Encounter for immunization: Secondary | ICD-10-CM | POA: Diagnosis not present

## 2022-07-19 DIAGNOSIS — Z0000 Encounter for general adult medical examination without abnormal findings: Secondary | ICD-10-CM | POA: Diagnosis not present

## 2022-07-19 DIAGNOSIS — Z131 Encounter for screening for diabetes mellitus: Secondary | ICD-10-CM | POA: Diagnosis not present

## 2022-07-23 IMAGING — MG MM DIGITAL SCREENING BILAT W/ TOMO AND CAD
6 of 10 series · 6 of 30 positions shown · non-contrast
Comparison: Previous exam(s).

CLINICAL DATA: Screening.

EXAM:
DIGITAL SCREENING BILATERAL MAMMOGRAM WITH TOMOSYNTHESIS AND CAD
TECHNIQUE: Bilateral screening digital craniocaudal and mediolateral oblique
mammograms were obtained. Bilateral screening digital breast
tomosynthesis was performed. The images were evaluated with
computer-aided detection.

[R CC synth-2D]
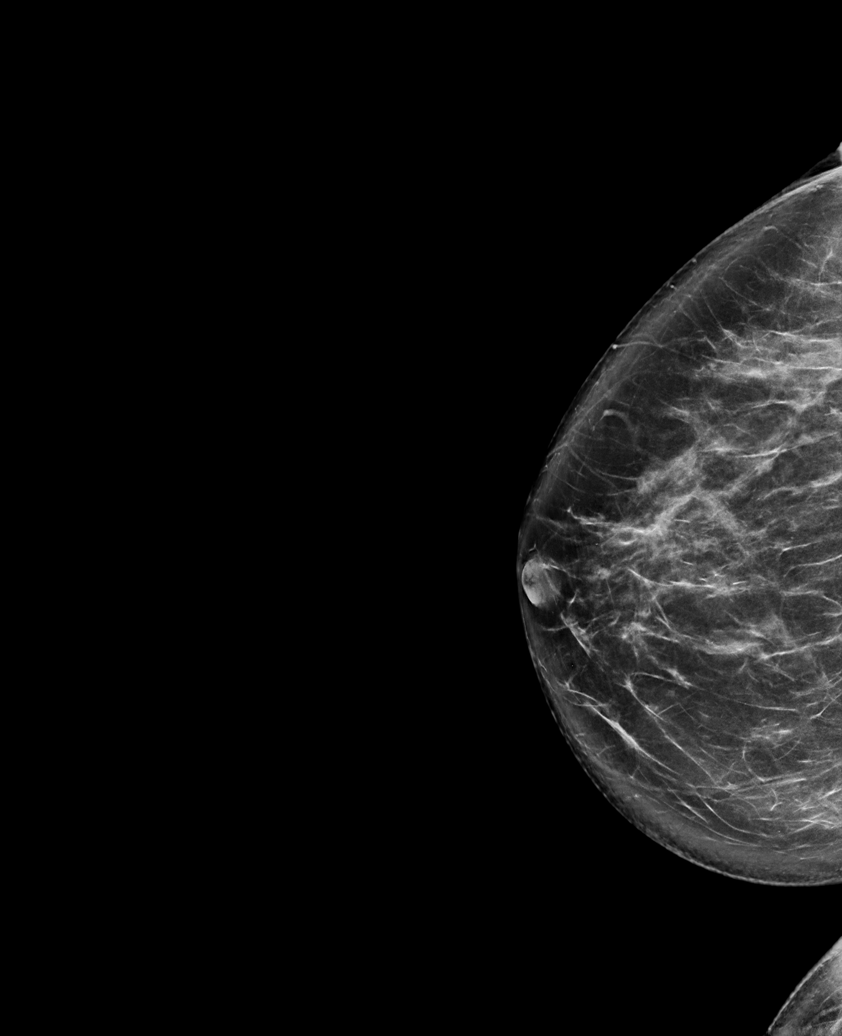

[L CC synth-2D (1 of 2)]
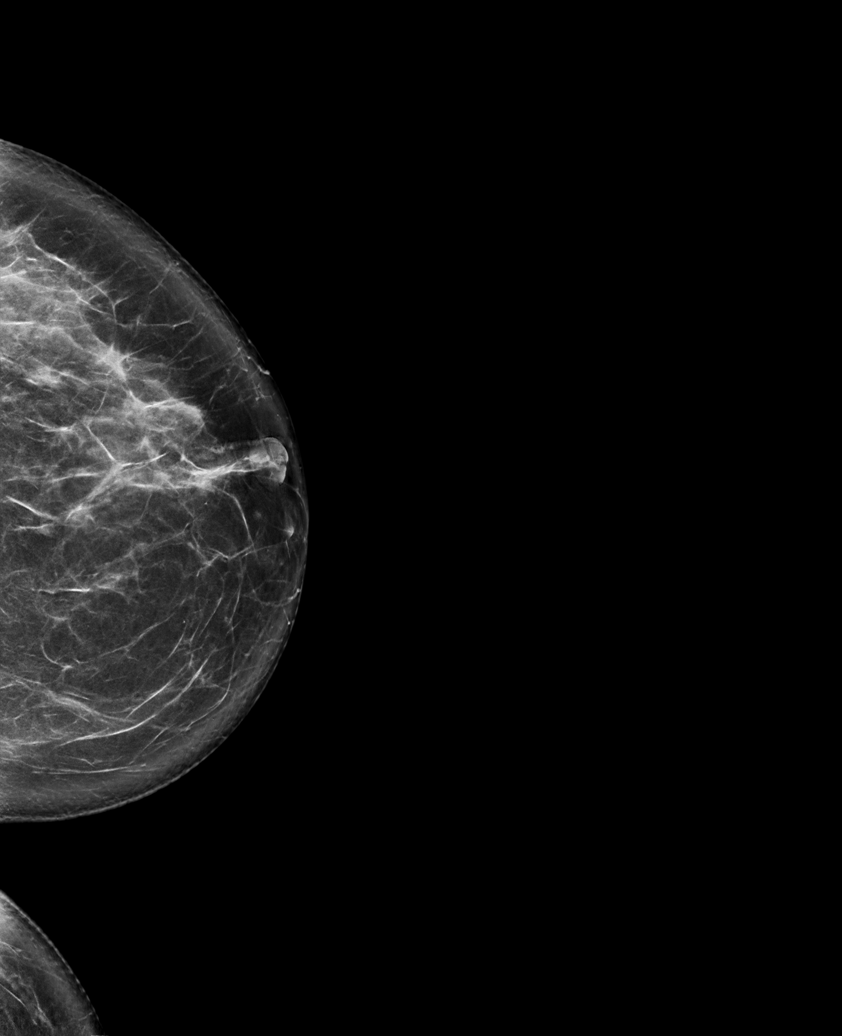

[R MLO synth-2D]
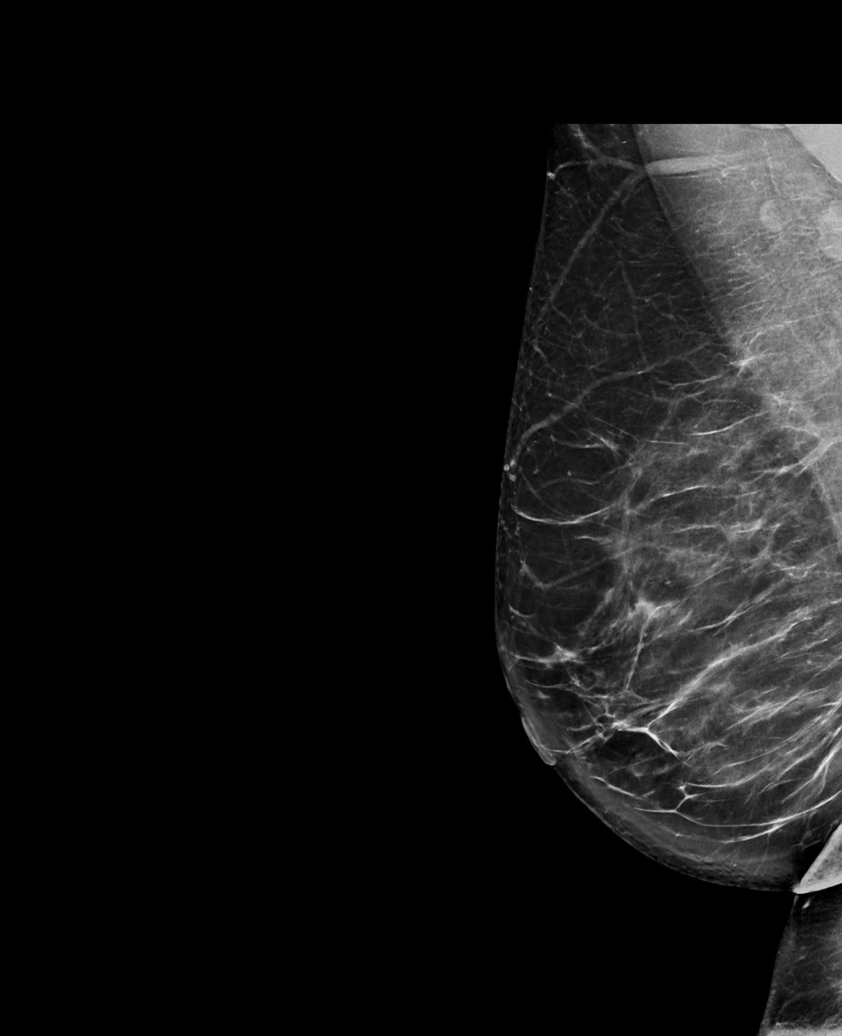

[L MLO synth-2D]
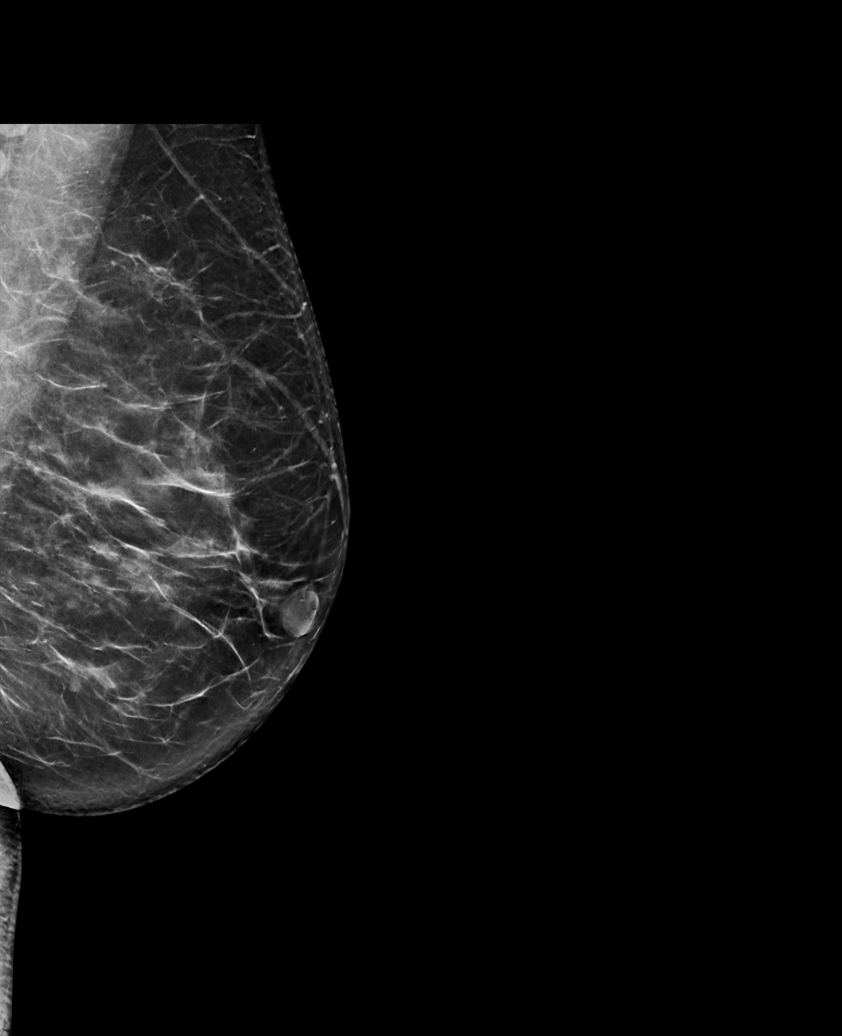

[L CC synth-2D (2 of 2)]
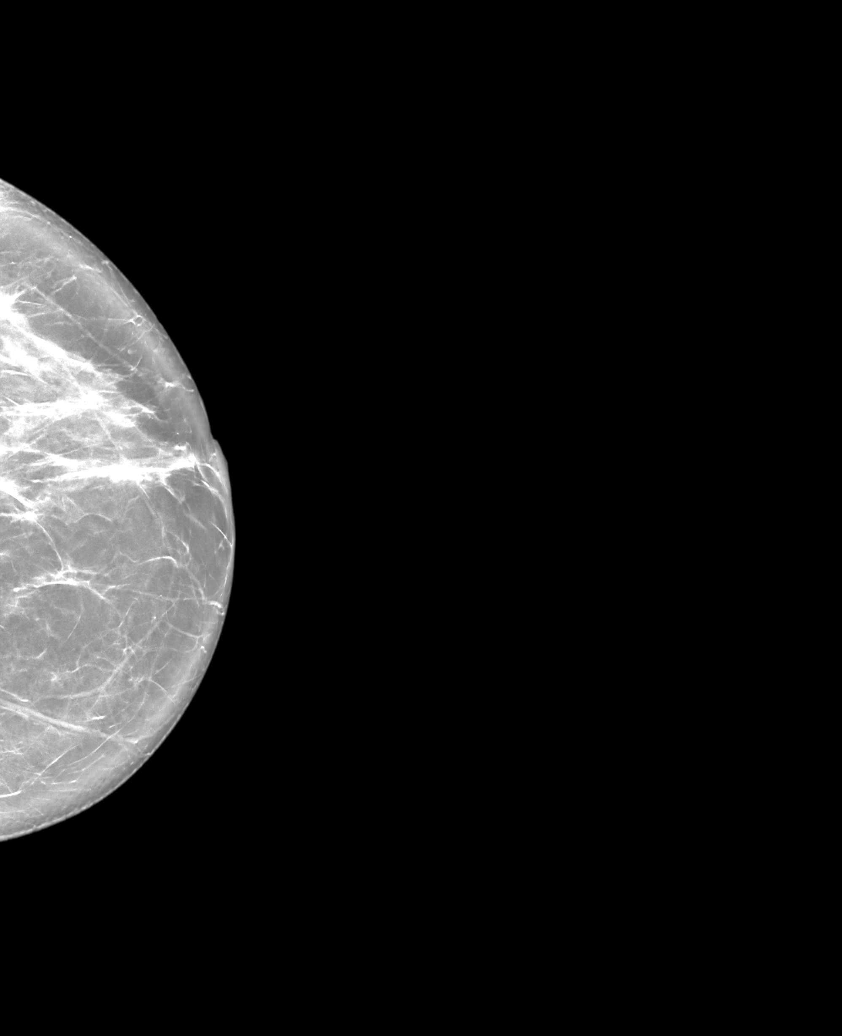

[L CC tomo · tomo slice 31/62.0]
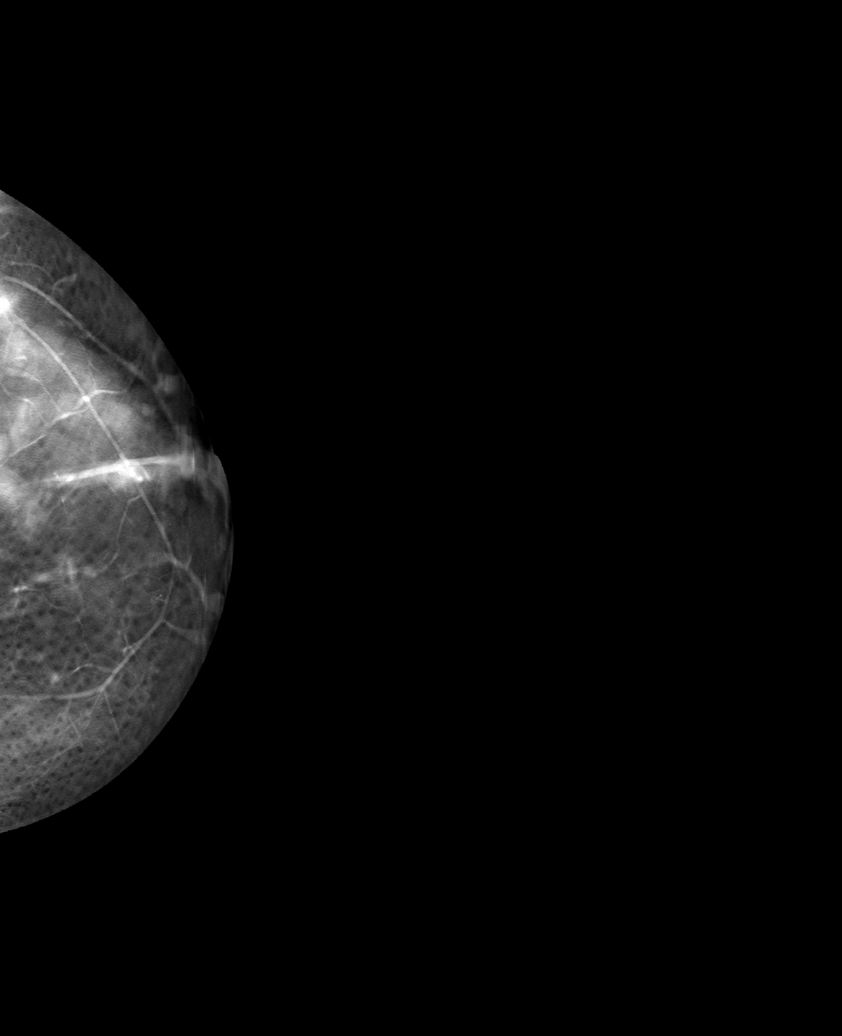

[6 of 30 positions shown; findings below may reference images not displayed]

ACR Breast Density Category b: There are scattered areas of
fibroglandular density.
FINDINGS: There are no findings suspicious for malignancy.
IMPRESSION: No mammographic evidence of malignancy. A result letter of this
screening mammogram will be mailed directly to the patient.

RECOMMENDATION:
Screening mammogram in one year. (Code:51-O-LD2)

BI-RADS CATEGORY  1: Negative.

## 2022-09-14 ENCOUNTER — Other Ambulatory Visit: Payer: Self-pay | Admitting: Obstetrics and Gynecology

## 2022-09-14 DIAGNOSIS — H7291 Unspecified perforation of tympanic membrane, right ear: Secondary | ICD-10-CM | POA: Diagnosis not present

## 2022-09-14 DIAGNOSIS — Z1231 Encounter for screening mammogram for malignant neoplasm of breast: Secondary | ICD-10-CM

## 2022-09-14 DIAGNOSIS — H9211 Otorrhea, right ear: Secondary | ICD-10-CM | POA: Diagnosis not present

## 2022-10-25 DIAGNOSIS — H7291 Unspecified perforation of tympanic membrane, right ear: Secondary | ICD-10-CM | POA: Diagnosis not present

## 2022-10-25 DIAGNOSIS — H9211 Otorrhea, right ear: Secondary | ICD-10-CM | POA: Diagnosis not present

## 2022-11-06 ENCOUNTER — Ambulatory Visit
Admission: RE | Admit: 2022-11-06 | Discharge: 2022-11-06 | Disposition: A | Discharge status: Home / Self Care | Payer: BC Managed Care – PPO | Source: Ambulatory Visit | Attending: Obstetrics and Gynecology | Admitting: Obstetrics and Gynecology

## 2022-11-06 DIAGNOSIS — Z1231 Encounter for screening mammogram for malignant neoplasm of breast: Secondary | ICD-10-CM | POA: Diagnosis not present

## 2022-11-20 DIAGNOSIS — D128 Benign neoplasm of rectum: Secondary | ICD-10-CM | POA: Diagnosis not present

## 2022-11-20 DIAGNOSIS — K6289 Other specified diseases of anus and rectum: Secondary | ICD-10-CM | POA: Diagnosis not present

## 2022-11-20 DIAGNOSIS — D123 Benign neoplasm of transverse colon: Secondary | ICD-10-CM | POA: Diagnosis not present

## 2022-11-20 DIAGNOSIS — Z1211 Encounter for screening for malignant neoplasm of colon: Secondary | ICD-10-CM | POA: Diagnosis not present

## 2022-11-20 DIAGNOSIS — K648 Other hemorrhoids: Secondary | ICD-10-CM | POA: Diagnosis not present

## 2022-11-20 DIAGNOSIS — K644 Residual hemorrhoidal skin tags: Secondary | ICD-10-CM | POA: Diagnosis not present

## 2023-01-22 DIAGNOSIS — Z01419 Encounter for gynecological examination (general) (routine) without abnormal findings: Secondary | ICD-10-CM | POA: Diagnosis not present

## 2023-01-22 DIAGNOSIS — Z124 Encounter for screening for malignant neoplasm of cervix: Secondary | ICD-10-CM | POA: Diagnosis not present

## 2023-01-22 DIAGNOSIS — Z6830 Body mass index (BMI) 30.0-30.9, adult: Secondary | ICD-10-CM | POA: Diagnosis not present

## 2023-07-18 DIAGNOSIS — Z131 Encounter for screening for diabetes mellitus: Secondary | ICD-10-CM | POA: Diagnosis not present

## 2023-07-18 DIAGNOSIS — Z13220 Encounter for screening for lipoid disorders: Secondary | ICD-10-CM | POA: Diagnosis not present

## 2023-07-18 DIAGNOSIS — Z0000 Encounter for general adult medical examination without abnormal findings: Secondary | ICD-10-CM | POA: Diagnosis not present

## 2023-10-17 ENCOUNTER — Other Ambulatory Visit: Payer: Self-pay | Admitting: Obstetrics and Gynecology

## 2023-10-17 DIAGNOSIS — Z1231 Encounter for screening mammogram for malignant neoplasm of breast: Secondary | ICD-10-CM

## 2023-11-09 ENCOUNTER — Ambulatory Visit
Admission: RE | Admit: 2023-11-09 | Discharge: 2023-11-09 | Disposition: A | Discharge status: Home / Self Care | Payer: BC Managed Care – PPO | Source: Ambulatory Visit | Attending: Obstetrics and Gynecology | Admitting: Obstetrics and Gynecology

## 2023-11-09 DIAGNOSIS — Z1231 Encounter for screening mammogram for malignant neoplasm of breast: Secondary | ICD-10-CM | POA: Diagnosis not present

## 2024-05-08 DIAGNOSIS — Z01419 Encounter for gynecological examination (general) (routine) without abnormal findings: Secondary | ICD-10-CM | POA: Diagnosis not present

## 2024-09-19 ENCOUNTER — Other Ambulatory Visit: Payer: Self-pay | Admitting: Obstetrics and Gynecology

## 2024-09-19 DIAGNOSIS — Z1231 Encounter for screening mammogram for malignant neoplasm of breast: Secondary | ICD-10-CM

## 2024-11-13 ENCOUNTER — Ambulatory Visit

## 2024-11-14 ENCOUNTER — Ambulatory Visit
